# Patient Record
Sex: Male | Born: 1995 | Race: Black or African American | Hispanic: No | Marital: Single | Smoking: Current every day smoker
Health system: Southern US, Community
[De-identification: ages and names within clinical notes are randomized; demographics above are authoritative.]

---
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
## 2016-11-06 ENCOUNTER — Encounter: Payer: Self-pay | Admitting: Family Medicine

## 2016-11-06 ENCOUNTER — Ambulatory Visit (INDEPENDENT_AMBULATORY_CARE_PROVIDER_SITE_OTHER): Payer: Medicaid Other | Admitting: Family Medicine

## 2016-11-06 DIAGNOSIS — S4992XA Unspecified injury of left shoulder and upper arm, initial encounter: Secondary | ICD-10-CM | POA: Diagnosis present

## 2016-11-06 NOTE — Patient Instructions (Signed)
You have a grade 3 shoulder separation. Ice the area 15 minutes at a time 3-4 times a day. Ibuprofen 600mg  three times a day with food OR aleve 2 tabs twice a day with food for pain and inflammation only as needed. Start physical therapy and do home exercises on days you don't go to therapy. Follow up with me in 6 weeks. If not improving would consider injection, repeating your x-rays.

## 2016-11-08 DIAGNOSIS — F329 Major depressive disorder, single episode, unspecified: Secondary | ICD-10-CM | POA: Insufficient documentation

## 2016-11-08 DIAGNOSIS — F319 Bipolar disorder, unspecified: Secondary | ICD-10-CM | POA: Insufficient documentation

## 2016-11-08 DIAGNOSIS — F603 Borderline personality disorder: Secondary | ICD-10-CM | POA: Insufficient documentation

## 2016-11-08 DIAGNOSIS — F419 Anxiety disorder, unspecified: Secondary | ICD-10-CM | POA: Insufficient documentation
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
## 2016-11-11 DIAGNOSIS — S4992XA Unspecified injury of left shoulder and upper arm, initial encounter: Secondary | ICD-10-CM | POA: Insufficient documentation

## 2016-11-11 NOTE — Assessment & Plan Note (Signed)
Independently reviewed shoulder radiographs from last year - along with clinical appearance he has a Grade 3 shoulder separation.  He has only mild issues however - mainly with heavy lifting, lying on this.  He would like to try conservative measures - physical therapy, home exercises, icing, ibuprofen or aleve.  Consider injection, repeat radiographs if not improving.

## 2016-12-18 ENCOUNTER — Ambulatory Visit: Payer: Medicaid Other | Admitting: Family Medicine
